# Patient Record
Sex: Female | Born: 1985 | Race: Black or African American | Hispanic: No | Marital: Single | Smoking: Current some day smoker
Health system: Southern US, Community
[De-identification: ages and names within clinical notes are randomized; demographics above are authoritative.]

## PROBLEM LIST (undated history)

## (undated) ENCOUNTER — Emergency Department (HOSPITAL_COMMUNITY): Admission: EM | Payer: Self-pay | Source: Home / Self Care

## (undated) DIAGNOSIS — H3322 Serous retinal detachment, left eye: Secondary | ICD-10-CM

## (undated) DIAGNOSIS — J45909 Unspecified asthma, uncomplicated: Secondary | ICD-10-CM

## (undated) HISTORY — PX: RETINAL DETACHMENT SURGERY: SHX105

---
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
## 2008-09-10 ENCOUNTER — Emergency Department (HOSPITAL_COMMUNITY): Admission: EM | Admit: 2008-09-10 | Discharge: 2008-09-10 | Payer: Self-pay | Admitting: Emergency Medicine

## 2010-07-28 NOTE — Op Note (Signed)
Jennifer Moyer, Jennifer Moyer              ACCOUNT NO.:  [id number]   MEDICAL RECORD NO.:  [id number]          PATIENT TYPE:  AMB   LOCATION:  SDS                          FACILITY:  [facility]   PHYSICIAN:  Alford Highland. Rankin, M.D.   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   PROCEDURES:  1. Scleral buckle using two 40, 70 and 287 silicone explants with      retinal chromopexy left eye.  2. External drainage subretinal fluid left eye.   SURGEON:  Alford Highland. Rankin, M.D.   ANESTHESIA:  General endotracheal anesthesia.   INDICATIONS FOR PROCEDURE:  Patient is a 25 year old woman who has  idiopathic rhegmatogenous detachment left eye superiorly, multiple  retinal breaks with risk of progression subretinal tension.  This is  probably related to old trauma that was largely asymptomatic and has  developed a crescent shape, visual field loss in the left eye  inferiorly.  Patient understands this is an attempt to reattach her  retina to prevent progression and to restore some peripheral visual  function as well.  She understands the risks of anesthesia, including  recurrence, loss of the eye, included but not limited to hemorrhage,  infection, scarring, new post hemorrhage, no change of vision, loss of  vision, and progression of disease despite intervention.  After  appropriate signed consent was obtained, patient was taken to the  operating room.  In the operating room, general endotracheal anesthesia  was instituted without difficulty.  Left periocular region was sterilely  prepped and draped in the usual ophthalmic fashion.  A lid speculum was  applied.  The conjunctival peritomy was then fashioned 360 degrees.  The  rectus muscle was isolated on 2-0 silk  ties.  Indirect ophthalmoscopy  was then performed.  A retinal hole was identified superiorly between  the 11 and 12 o'clock positions, as well as some atrophic areas were  treated with transscleral retinal chromopexy.  No complications  occurred.  The bed of the detachment extended from approximately the 10  o'clock position over superiorly to the 2 o'clock position.  287 explant  was selected with a 240 encircling band that was placed under the rectus  muscles and the buckle itself was from the 9:30 position over to the  2:30 position.  The ends of the band were enjoined in the inferonasal  quadrant with a 70 sleeve.  At this time, the buckle was then  temporarily tied with 5-0 Mersilene mattress sutures.  External drainage  of subretinal fluid was then carried out in the superonasal quadrant  with direct visualization with the head scope.  At this time, all fluid  was removed and this without difficulty.  No complications occurred.  The buckle was then tightened to the appropriate tension and height.  The band was then secured and tightened.  It was necessary to perform  aqueous  paracentesis, which was done.  0.1 mL was removed without  difficulty.  At this time, the band and the buckle were irrigated with  Bugg juice.  The retina was reattached  nicely with 360 degrees.  At this time, the conjunctiva and tenons were  closed in layers with 7-0 Vicryl sutures.  Subconjunctival Decadron and  antibiotic applied.  Sterile patch and Fox shield applied.  Patient  tolerated the procedure without complication.      Alford Highland Rankin, M.D.  Electronically Signed     GAR/MEDQ  D:  11/09/2006  T:  11/10/2006  Job:  [id number]

## 2010-12-25 LAB — CBC
HCT: 37.7
Hemoglobin: 12.8
MCHC: 33.8
RBC: 3.97
RDW: 14

## 2010-12-25 LAB — BASIC METABOLIC PANEL
CO2: 27
Calcium: 9.3
GFR calc Af Amer: >60
Glucose, Bld: 93
Potassium: 3.7
Sodium: 139

## 2011-12-26 ENCOUNTER — Emergency Department (HOSPITAL_COMMUNITY)
Admission: EM | Admit: 2011-12-26 | Discharge: 2011-12-26 | Disposition: A | Discharge status: Home / Self Care | Payer: No Typology Code available for payment source | Attending: Emergency Medicine | Admitting: Emergency Medicine

## 2011-12-26 ENCOUNTER — Emergency Department (HOSPITAL_COMMUNITY): Payer: No Typology Code available for payment source

## 2011-12-26 ENCOUNTER — Encounter (HOSPITAL_COMMUNITY): Payer: Self-pay | Admitting: *Deleted

## 2011-12-26 DIAGNOSIS — M549 Dorsalgia, unspecified: Secondary | ICD-10-CM

## 2011-12-26 DIAGNOSIS — M542 Cervicalgia: Secondary | ICD-10-CM | POA: Insufficient documentation

## 2011-12-26 DIAGNOSIS — M546 Pain in thoracic spine: Secondary | ICD-10-CM | POA: Insufficient documentation

## 2011-12-26 HISTORY — DX: Serous retinal detachment, left eye: H33.22

## 2011-12-26 HISTORY — DX: Unspecified asthma, uncomplicated: J45.909

## 2011-12-26 MED ORDER — HYDROCODONE-ACETAMINOPHEN 5-325 MG PO TABS
1.0000 | ORAL_TABLET | Freq: Once | ORAL | Status: AC
  Administered 2011-12-26: 1 via ORAL
  Filled 2011-12-26: qty 1

## 2011-12-26 MED ORDER — IBUPROFEN 800 MG PO TABS: 800.0000 mg | ORAL_TABLET | Freq: Three times a day (TID) | ORAL | Status: DC | PRN

## 2011-12-26 MED ORDER — CYCLOBENZAPRINE HCL 10 MG PO TABS: 10.0000 mg | ORAL_TABLET | Freq: Three times a day (TID) | ORAL | Status: DC | PRN
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
## 2011-12-26 NOTE — ED Notes (Signed)
Pt discharged home with family. Pt informed to keep ice on her neck and upper back and to take caution with activities on her pain medication. Pt advised to use ice, and come back for complications.

## 2011-12-27 NOTE — ED Provider Notes (Signed)
Medical screening examination/treatment/procedure(s) were performed by non-physician practitioner and as supervising physician I was immediately available for consultation/collaboration.  Doug Sou, MD 12/27/11 [phone]

## 2012-02-01 ENCOUNTER — Emergency Department (HOSPITAL_COMMUNITY)
Admission: EM | Admit: 2012-02-01 | Discharge: 2012-02-01 | Disposition: A | Discharge status: Home / Self Care | Payer: No Typology Code available for payment source | Attending: Emergency Medicine | Admitting: Emergency Medicine

## 2012-02-01 ENCOUNTER — Encounter (HOSPITAL_COMMUNITY): Payer: Self-pay | Admitting: Emergency Medicine

## 2012-02-01 ENCOUNTER — Emergency Department (HOSPITAL_COMMUNITY): Admission: EM | Admit: 2012-02-01 | Discharge: 2012-02-01 | Discharge status: Against Medical Advice | Payer: Self-pay

## 2012-02-01 DIAGNOSIS — Y929 Unspecified place or not applicable: Secondary | ICD-10-CM | POA: Insufficient documentation

## 2012-02-01 DIAGNOSIS — R42 Dizziness and giddiness: Secondary | ICD-10-CM | POA: Insufficient documentation

## 2012-02-01 DIAGNOSIS — R51 Headache: Secondary | ICD-10-CM

## 2012-02-01 DIAGNOSIS — Z8669 Personal history of other diseases of the nervous system and sense organs: Secondary | ICD-10-CM | POA: Insufficient documentation

## 2012-02-01 DIAGNOSIS — Z79899 Other long term (current) drug therapy: Secondary | ICD-10-CM | POA: Insufficient documentation

## 2012-02-01 DIAGNOSIS — S060X9A Concussion with loss of consciousness of unspecified duration, initial encounter: Secondary | ICD-10-CM

## 2012-02-01 DIAGNOSIS — J45909 Unspecified asthma, uncomplicated: Secondary | ICD-10-CM | POA: Insufficient documentation

## 2012-02-01 DIAGNOSIS — S060XAA Concussion with loss of consciousness status unknown, initial encounter: Secondary | ICD-10-CM | POA: Insufficient documentation

## 2012-02-01 DIAGNOSIS — Y939 Activity, unspecified: Secondary | ICD-10-CM | POA: Insufficient documentation

## 2012-02-01 DIAGNOSIS — X58XXXA Exposure to other specified factors, initial encounter: Secondary | ICD-10-CM | POA: Insufficient documentation

## 2012-02-01 MED ORDER — SODIUM CHLORIDE 0.9 % IV BOLUS (SEPSIS)
1000.0000 mL | Freq: Once | INTRAVENOUS | Status: AC
  Administered 2012-02-01: 1000 mL via INTRAVENOUS

## 2012-02-01 MED ORDER — METOCLOPRAMIDE HCL 5 MG/ML IJ SOLN
10.0000 mg | Freq: Once | INTRAMUSCULAR | Status: AC
  Administered 2012-02-01: 10 mg via INTRAVENOUS
  Filled 2012-02-01: qty 2

## 2012-02-01 MED ORDER — KETOROLAC TROMETHAMINE 30 MG/ML IJ SOLN
30.0000 mg | Freq: Once | INTRAMUSCULAR | Status: AC
  Administered 2012-02-01: 30 mg via INTRAVENOUS
  Filled 2012-02-01: qty 1

## 2012-02-01 MED ORDER — CYCLOBENZAPRINE HCL 10 MG PO TABS: 10.0000 mg | ORAL_TABLET | Freq: Two times a day (BID) | ORAL | Status: DC | PRN

## 2012-02-01 MED ORDER — IBUPROFEN 600 MG PO TABS: 600.0000 mg | ORAL_TABLET | Freq: Four times a day (QID) | ORAL | Status: DC | PRN

## 2012-02-01 MED ORDER — HYDROCODONE-ACETAMINOPHEN 5-325 MG PO TABS: 1.0000 | ORAL_TABLET | ORAL | Status: DC | PRN
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
## 2012-02-01 NOTE — ED Notes (Signed)
Pt states that she has had a headache for 2 weeks that started gradually and has gotten worse since then.  States that if she stands up too fast, she gets lightheaded.  Also states that sometimes her middle and ring fingertips on her right hand go numb.  Pain 6/10.  States that she gets nauseous sometimes.  No vomiting or diarrhea.

## 2014-03-21 ENCOUNTER — Encounter: Payer: Self-pay | Admitting: Sports Medicine

## 2014-03-21 ENCOUNTER — Ambulatory Visit (INDEPENDENT_AMBULATORY_CARE_PROVIDER_SITE_OTHER): Payer: 59 | Admitting: Sports Medicine

## 2014-03-21 VITALS — BP 122/60 | HR 58 | Temp 98.4°F | Resp 18

## 2014-03-21 DIAGNOSIS — R300 Dysuria: Secondary | ICD-10-CM

## 2014-03-21 DIAGNOSIS — Z202 Contact with and (suspected) exposure to infections with a predominantly sexual mode of transmission: Secondary | ICD-10-CM

## 2014-03-21 LAB — POCT URINALYSIS DIPSTICK
BILIRUBIN UA: NEGATIVE
GLUCOSE UA: NEGATIVE
KETONES UA: NEGATIVE
Leukocytes, UA: NEGATIVE
Nitrite, UA: NEGATIVE
Protein, UA: NEGATIVE
SPEC GRAV UA: 1.02
UROBILINOGEN UA: 0.2
pH, UA: 7.5

## 2014-03-21 LAB — POCT WET PREP WITH KOH
CLUE CELLS WET PREP PER HPF POC: NEGATIVE
KOH PREP POC: NEGATIVE
RBC WET PREP PER HPF POC: NEGATIVE
Trichomonas, UA: NEGATIVE
Yeast Wet Prep HPF POC: NEGATIVE

## 2014-03-21 LAB — POCT UA - MICROSCOPIC ONLY
CASTS, UR, LPF, POC: NEGATIVE
CRYSTALS, UR, HPF, POC: NEGATIVE
MUCUS UA: NEGATIVE
Yeast, UA: NEGATIVE

## 2014-03-21 LAB — POCT URINE PREGNANCY: Preg Test, Ur: NEGATIVE

## 2014-03-21 MED ORDER — AZITHROMYCIN 500 MG PO TABS: 1000.0000 mg | ORAL_TABLET | Freq: Once | ORAL | Status: DC

## 2014-03-21 MED ORDER — CEFTRIAXONE SODIUM 1 G IJ SOLR
500.0000 mg | Freq: Once | INTRAMUSCULAR | Status: AC
  Administered 2014-03-21: 500 mg via INTRAMUSCULAR
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
## 2014-03-21 NOTE — Patient Instructions (Signed)
We are treating for both Gonorrhea and Chlamydia emperically due to your symptoms.  We will be in touch with the final results of your blood work and the results of the Gonorrhea and Chlamydia tests  Sexually Transmitted Disease A sexually transmitted disease (STD) is a disease or infection that may be passed (transmitted) from person to person, usually during sexual activity. This may happen by way of saliva, semen, blood, vaginal mucus, or urine. Common STDs include:   Gonorrhea.   Chlamydia.   Syphilis.   HIV and AIDS.   Genital herpes.   Hepatitis B and C.   Trichomonas.   Human papillomavirus (HPV).   Pubic lice.   Scabies.  Mites.  Bacterial vaginosis. WHAT ARE CAUSES OF STDs? An STD may be caused by bacteria, a virus, or parasites. STDs are often transmitted during sexual activity if one person is infected. However, they may also be transmitted through nonsexual means. STDs may be transmitted after:   Sexual intercourse with an infected person.   Sharing sex toys with an infected person.   Sharing needles with an infected person or using unclean piercing or tattoo needles.  Having intimate contact with the genitals, mouth, or rectal areas of an infected person.   Exposure to infected fluids during birth. WHAT ARE THE SIGNS AND SYMPTOMS OF STDs? Different STDs have different symptoms. Some people may not have any symptoms. If symptoms are present, they may include:   Painful or bloody urination.   Pain in the pelvis, abdomen, vagina, anus, throat, or eyes.   A skin rash, itching, or irritation.  Growths, ulcerations, blisters, or sores in the genital and anal areas.  Abnormal vaginal discharge with or without bad odor.   Penile discharge in men.   Fever.   Pain or bleeding during sexual intercourse.   Swollen glands in the groin area.   Yellow skin and eyes (jaundice). This is seen with hepatitis.   Swollen  testicles.  Infertility.  Sores and blisters in the mouth. HOW ARE STDs DIAGNOSED? To make a diagnosis, your health care provider may:   Take a medical history.   Perform a physical exam.   Take a sample of any discharge to examine.  Swab the throat, cervix, opening to the penis, rectum, or vagina for testing.  Test a sample of your first morning urine.   Perform blood tests.   Perform a Pap test, if this applies.   Perform a colposcopy.   Perform a laparoscopy.  HOW ARE STDs TREATED? Treatment depends on the STD. Some STDs may be treated but not cured.   Chlamydia, gonorrhea, trichomonas, and syphilis can be cured with antibiotic medicine.   Genital herpes, hepatitis, and HIV can be treated, but not cured, with prescribed medicines. The medicines lessen symptoms.   Genital warts from HPV can be treated with medicine or by freezing, burning (electrocautery), or surgery. Warts may come back.   HPV cannot be cured with medicine or surgery. However, abnormal areas may be removed from the cervix, vagina, or vulva.   If your diagnosis is confirmed, your recent sexual partners need treatment. This is true even if they are symptom-free or have a negative culture or evaluation. They should not have sex until their health care providers say it is okay. HOW CAN I REDUCE MY RISK OF GETTING AN STD? Take these steps to reduce your risk of getting an STD:  Use latex condoms, dental dams, and water-soluble lubricants during sexual activity. Do not use  petroleum jelly or oils.  Avoid having multiple sex partners.  Do not have sex with someone who has other sex partners.  Do not have sex with anyone you do not know or who is at high risk for an STD.  Avoid risky sex practices that can break your skin.  Do not have sex if you have open sores on your mouth or skin.  Avoid drinking too much alcohol or taking illegal drugs. Alcohol and drugs can affect your judgment and put  you in a vulnerable position.  Avoid engaging in oral and anal sex acts.  Get vaccinated for HPV and hepatitis. If you have not received these vaccines in the past, talk to your health care provider about whether one or both might be right for you.   If you are at risk of being infected with HIV, it is recommended that you take a prescription medicine daily to prevent HIV infection. This is called pre-exposure prophylaxis (PrEP). You are considered at risk if:  You are a man who has sex with other men (MSM).  You are a heterosexual man or woman and are sexually active with more than one partner.  You take drugs by injection.  You are sexually active with a partner who has HIV.  Talk with your health care provider about whether you are at high risk of being infected with HIV. If you choose to begin PrEP, you should first be tested for HIV. You should then be tested every 3 months for as long as you are taking PrEP.  WHAT SHOULD I DO IF I THINK I HAVE AN STD?  See your health care provider.   Tell your sexual partner(s). They should be tested and treated for any STDs.  Do not have sex until your health care provider says it is okay. WHEN SHOULD I GET IMMEDIATE MEDICAL CARE? Contact your health care provider right away if:   You have severe abdominal pain.  You are a man and notice swelling or pain in your testicles.  You are a woman and notice swelling or pain in your vagina. Document Released: 05/22/2002 Document Revised: 03/06/2013 Document Reviewed: 09/19/2012 [facility] Patient Information 2015 [address] Addition[employer]Care, [place]. This information is not intended to replace advice given to you by your health care provider. Make sure you discuss any questions you have with your health care provider.

## 2014-03-22 LAB — RPR

## 2014-03-22 LAB — HIV ANTIBODY (ROUTINE TESTING W REFLEX): HIV 1&2 Ab, 4th Generation: NONREACTIVE

## 2014-03-23 NOTE — Progress Notes (Signed)
History and physical examinations reviewed.  Agree with assessment and plan. Danyael Alipio Martin Ziah Leandro, M.D. [facility]  [place] [address] [place], [place]  [address] [phone] phone [phone] fax  

## 2014-03-26 LAB — GC/CHLAMYDIA PROBE AMP
CT PROBE, AMP APTIMA: NEGATIVE
GC Probe RNA: NEGATIVE

## 2016-02-16 ENCOUNTER — Emergency Department (HOSPITAL_COMMUNITY)
Admission: EM | Admit: 2016-02-16 | Discharge: 2016-02-17 | Disposition: A | Discharge status: Home / Self Care | Payer: 59 | Attending: Emergency Medicine | Admitting: Emergency Medicine

## 2016-02-16 ENCOUNTER — Emergency Department (HOSPITAL_COMMUNITY): Payer: 59

## 2016-02-16 ENCOUNTER — Encounter (HOSPITAL_COMMUNITY): Payer: Self-pay | Admitting: Emergency Medicine

## 2016-02-16 DIAGNOSIS — F17200 Nicotine dependence, unspecified, uncomplicated: Secondary | ICD-10-CM | POA: Diagnosis not present

## 2016-02-16 DIAGNOSIS — R05 Cough: Secondary | ICD-10-CM | POA: Diagnosis present

## 2016-02-16 DIAGNOSIS — Z79899 Other long term (current) drug therapy: Secondary | ICD-10-CM | POA: Diagnosis not present

## 2016-02-16 DIAGNOSIS — J45909 Unspecified asthma, uncomplicated: Secondary | ICD-10-CM | POA: Diagnosis not present

## 2016-02-16 DIAGNOSIS — B349 Viral infection, unspecified: Secondary | ICD-10-CM | POA: Insufficient documentation

## 2016-02-16 LAB — COMPREHENSIVE METABOLIC PANEL
ALBUMIN: 4.2 g/dL (ref 3.5–5.0)
ALK PHOS: 45 U/L (ref 38–126)
ALT: 21 U/L (ref 14–54)
ANION GAP: 8 (ref 5–15)
AST: 26 U/L (ref 15–41)
BUN: 12 mg/dL (ref 6–20)
CHLORIDE: 106 mmol/L (ref 101–111)
CO2: 25 mmol/L (ref 22–32)
Calcium: 8.8 mg/dL — ABNORMAL LOW (ref 8.9–10.3)
Creatinine, Ser: 0.61 mg/dL (ref 0.44–1.00)
GFR calc non Af Amer: >60 mL/min (ref >60)
GLUCOSE: 82 mg/dL (ref 65–99)
POTASSIUM: 4 mmol/L (ref 3.5–5.1)
SODIUM: 139 mmol/L (ref 135–145)
Total Bilirubin: 0.9 mg/dL (ref 0.3–1.2)
Total Protein: 7 g/dL (ref 6.5–8.1)

## 2016-02-16 LAB — CBC
HEMATOCRIT: 39.2 % (ref 36.0–46.0)
HEMOGLOBIN: 13.4 g/dL (ref 12.0–15.0)
MCH: 32.5 pg (ref 26.0–34.0)
MCHC: 34.2 g/dL (ref 30.0–36.0)
MCV: 95.1 fL (ref 78.0–100.0)
Platelets: 258 10*3/uL (ref 150–400)
RBC: 4.12 MIL/uL (ref 3.87–5.11)
RDW: 12.9 % (ref 11.5–15.5)
WBC: 3.9 10*3/uL — ABNORMAL LOW (ref 4.0–10.5)

## 2016-02-16 LAB — URINALYSIS, ROUTINE W REFLEX MICROSCOPIC
BILIRUBIN URINE: NEGATIVE
Glucose, UA: NEGATIVE mg/dL
HGB URINE DIPSTICK: NEGATIVE
KETONES UR: NEGATIVE mg/dL
Leukocytes, UA: NEGATIVE
Nitrite: NEGATIVE
Protein, ur: NEGATIVE mg/dL
SPECIFIC GRAVITY, URINE: 1.023 (ref 1.005–1.030)
pH: 7 (ref 5.0–8.0)

## 2016-02-16 LAB — I-STAT BETA HCG BLOOD, ED (MC, WL, AP ONLY)

## 2016-02-16 LAB — LIPASE, BLOOD: LIPASE: 61 U/L — AB (ref 11–51)

## 2016-02-16 MED ORDER — ONDANSETRON HCL 4 MG/2ML IJ SOLN
4.0000 mg | Freq: Once | INTRAMUSCULAR | Status: AC
  Administered 2016-02-16: 4 mg via INTRAVENOUS
  Filled 2016-02-16: qty 2

## 2016-02-16 MED ORDER — ONDANSETRON 4 MG PO TBDP: 4.0000 mg | ORAL_TABLET | Freq: Three times a day (TID) | ORAL | 0 refills | Status: AC | PRN

## 2016-02-16 MED ORDER — KETOROLAC TROMETHAMINE 30 MG/ML IJ SOLN
30.0000 mg | Freq: Once | INTRAMUSCULAR | Status: AC
  Administered 2016-02-16: 30 mg via INTRAVENOUS
  Filled 2016-02-16: qty 1

## 2016-02-16 MED ORDER — SODIUM CHLORIDE 0.9 % IV BOLUS (SEPSIS)
1000.0000 mL | Freq: Once | INTRAVENOUS | Status: AC
  Administered 2016-02-17: 1000 mL via INTRAVENOUS

## 2016-02-16 MED ORDER — SODIUM CHLORIDE 0.9 % IV BOLUS (SEPSIS)
1000.0000 mL | Freq: Once | INTRAVENOUS | Status: AC
  Administered 2016-02-16: 1000 mL via INTRAVENOUS

## 2016-02-16 NOTE — ED Notes (Signed)
Pt is aware urine sample is needed. 

## 2016-02-16 NOTE — ED Notes (Signed)
In xray

## 2016-02-16 NOTE — ED Triage Notes (Signed)
Pt states she has been sick since the day before thanksgiving  Pt states sxs are nausea, vomiting, diarrhea, decreased appetite, body aches, headaches, feeling hot then having the chills, intermittent fevers, productive cough, and shortness of breath

## 2016-02-16 NOTE — ED Provider Notes (Signed)
[facility]-EMERGENCY DEPT Provider Note   CSN: [id number] Arrival date & time: 02/16/16  1941     History   Chief Complaint Chief Complaint  Patient presents with  . flu like symptoms    HPI Jennifer Moyer is a 30 y.o. female.  HPI Patient presents with nausea vomiting diarrhea and myalgias headaches chills cough and shortness of breath. She's had for the last 2 weeks. No one has had sick contacts. No known weight loss. Has had a cough with mild production. Slight sore throat. Has had a throbbing headache. Mild dull abdominal pain.   Past Medical History:  Diagnosis Date  . Asthma   . Detached retina, left     There are no active problems to display for this patient.   Past Surgical History:  Procedure Laterality Date  . RETINAL DETACHMENT SURGERY      OB History    No data available       Home Medications    Prior to Admission medications   Medication Sig Start Date End Date Taking? Authorizing Provider  albuterol (PROVENTIL HFA;VENTOLIN HFA) 108 (90 BASE) MCG/ACT inhaler Inhale 2 puffs into the lungs every 6 (six) hours as needed. For shortness of breath or wheezing   Yes Historical Provider, MD  Ascorbic Acid (VITAMIN C PO) Take 1 tablet by mouth daily.   Yes Historical Provider, MD  Fexofenadine HCl (MUCINEX ALLERGY PO) Take 1 tablet by mouth daily.   Yes Historical Provider, MD  ibuprofen (ADVIL,MOTRIN) 200 MG tablet Take 800 mg by mouth every 6 (six) hours as needed.   Yes Historical Provider, MD  ibuprofen (ADVIL,MOTRIN) 800 MG tablet Take 800 mg by mouth every 8 (eight) hours as needed. For pain   Yes Historical Provider, MD  Multiple Vitamins-Minerals (MULTIVITAMIN GUMMIES ADULT) CHEW Chew 1 tablet by mouth every morning.   Yes Historical Provider, MD  Pseudoeph-Doxylamine-DM-APAP (NYQUIL PO) Take 30 mLs by mouth at bedtime.   Yes Historical Provider, MD  vitamin B-12 (CYANOCOBALAMIN) 1000 MCG tablet Take 1,000 mcg by mouth every morning.   Yes Historical  Provider, MD  ondansetron (ZOFRAN-ODT) 4 MG disintegrating tablet Take 1 tablet (4 mg total) by mouth every 8 (eight) hours as needed for nausea or vomiting. 02/16/16   Benjiman CoreNathan Aniruddh Ciavarella, MD    Family History Family History  Problem Relation Age of Onset  . Diabetes Other   . Hypertension Other     Social History Social History  Substance Use Topics  . Smoking status: Current Some Day Smoker  . Smokeless tobacco: Never Used  . Alcohol use No     Allergies   Almond oil   Review of Systems Review of Systems  Constitutional: Positive for appetite change.  HENT: Negative for congestion.   Respiratory: Positive for cough. Negative for shortness of breath.   Cardiovascular: Negative for chest pain.  Gastrointestinal: Positive for abdominal pain, diarrhea, nausea and vomiting.  Genitourinary: Negative for dysuria.  Musculoskeletal: Negative for back pain.  Skin: Negative for wound.  Neurological: Negative for numbness.  Psychiatric/Behavioral: Negative for confusion.     Physical Exam Updated Vital Signs BP 111/98 (BP Location: Right Arm)   Pulse 64   Temp 97.5 F (36.4 C) (Oral)   Resp 18   Ht 5\' 6"  (1.676 m)   Wt 140 lb (63.5 kg)   LMP 02/08/2016 (Approximate)   SpO2 100%   BMI 22.60 kg/m   Physical Exam  Constitutional: She appears well-developed.  HENT:  Head: Atraumatic.  Eyes: EOM are normal.  Neck: Neck supple.  Cardiovascular: Normal rate.   Pulmonary/Chest: Effort normal.  Mildly harsh breath sounds.  Abdominal: Soft. There is no tenderness.  Musculoskeletal: She exhibits no edema.  Neurological: She is alert.  Skin: Skin is warm. Capillary refill takes less than 2 seconds.  Psychiatric: She has a normal mood and affect.     ED Treatments / Results  Labs (all labs ordered are listed, but only abnormal results are displayed) Labs Reviewed  LIPASE, BLOOD - Abnormal; Notable for the following:       Result Value   Lipase 61 (*)    All other  components within normal limits  COMPREHENSIVE METABOLIC PANEL - Abnormal; Notable for the following:    Calcium 8.8 (*)    All other components within normal limits  CBC - Abnormal; Notable for the following:    WBC 3.9 (*)    All other components within normal limits  URINALYSIS, ROUTINE W REFLEX MICROSCOPIC (NOT AT [facility])  I-STAT BETA HCG BLOOD, ED ([facility], [facility], [facility] ONLY)    EKG  EKG Interpretation None       Radiology Dg Chest 2 View  Result Date: 02/16/2016 CLINICAL DATA:  Cough, body ache and weakness. Intermittent fever x2 weeks. EXAM: CHEST  2 VIEW COMPARISON:  Thoracic spine radiographs from 12/26/2011 FINDINGS: The heart size and mediastinal contours are within normal limits. Both lungs are clear. The visualized skeletal structures are unremarkable. IMPRESSION: No active cardiopulmonary disease. Electronically Signed   By: Tollie Ethavid  Kwon M.D.   On: 02/16/2016 23:10    Procedures Procedures (including critical care time)  Medications Ordered in ED Medications  sodium chloride 0.9 % bolus 1,000 mL (1,000 mLs Intravenous New Bag/Given 02/16/16 2316)  sodium chloride 0.9 % bolus 1,000 mL (not administered)  ondansetron (ZOFRAN) injection 4 mg (4 mg Intravenous Given 02/16/16 2316)  ketorolac (TORADOL) 30 MG/ML injection 30 mg (30 mg Intravenous Given 02/16/16 2317)     Initial Impression / Assessment and Plan / ED Course  I have reviewed the triage vital signs and the nursing notes.  Pertinent labs & imaging results that were available during my care of the patient were reviewed by me and considered in my medical decision making (see chart for details).  Clinical Course     Patient with nausea vomiting diarrhea and other viral symptoms. Headache and cough. Labs reassuring urine pending at this time. Second liter of fluid given. Care turned over to Dr. Daun PeacockPalombo likely discharge home.  Final Clinical Impressions(s) / ED Diagnoses   Final diagnoses:  Viral infection    New  Prescriptions New Prescriptions   ONDANSETRON (ZOFRAN-ODT) 4 MG DISINTEGRATING TABLET    Take 1 tablet (4 mg total) by mouth every 8 (eight) hours as needed for nausea or vomiting.     Benjiman CoreNathan Brenae Lasecki, MD 02/16/16 2351

## 2016-02-17 MED ORDER — ALBUTEROL SULFATE HFA 108 (90 BASE) MCG/ACT IN AERS: 1.0000 | INHALATION_SPRAY | Freq: Four times a day (QID) | RESPIRATORY_TRACT | 0 refills | Status: AC | PRN

## 2016-02-17 NOTE — ED Notes (Signed)
Iv placed 23;15 dec 4

## 2017-07-09 ENCOUNTER — Ambulatory Visit (HOSPITAL_COMMUNITY)
Admission: EM | Admit: 2017-07-09 | Discharge: 2017-07-09 | Disposition: A | Discharge status: Home / Self Care | Payer: 59 | Attending: Family Medicine | Admitting: Family Medicine

## 2017-07-09 ENCOUNTER — Ambulatory Visit (INDEPENDENT_AMBULATORY_CARE_PROVIDER_SITE_OTHER): Payer: 59

## 2017-07-09 ENCOUNTER — Other Ambulatory Visit: Payer: Self-pay

## 2017-07-09 ENCOUNTER — Encounter (HOSPITAL_COMMUNITY): Payer: Self-pay | Admitting: Family Medicine

## 2017-07-09 DIAGNOSIS — R109 Unspecified abdominal pain: Secondary | ICD-10-CM | POA: Diagnosis present

## 2017-07-09 DIAGNOSIS — N390 Urinary tract infection, site not specified: Secondary | ICD-10-CM | POA: Insufficient documentation

## 2017-07-09 DIAGNOSIS — K589 Irritable bowel syndrome without diarrhea: Secondary | ICD-10-CM | POA: Diagnosis not present

## 2017-07-09 DIAGNOSIS — R8281 Pyuria: Secondary | ICD-10-CM

## 2017-07-09 LAB — POCT URINALYSIS DIP (DEVICE)
Bilirubin Urine: NEGATIVE
Glucose, UA: NEGATIVE mg/dL
KETONES UR: NEGATIVE mg/dL
Nitrite: NEGATIVE
PH: 6 (ref 5.0–8.0)
Protein, ur: NEGATIVE mg/dL
SPECIFIC GRAVITY, URINE: 1.015 (ref 1.005–1.030)
Urobilinogen, UA: 0.2 mg/dL (ref 0.0–1.0)

## 2017-07-09 MED ORDER — SULFAMETHOXAZOLE-TRIMETHOPRIM 800-160 MG PO TABS: 1.0000 | ORAL_TABLET | Freq: Two times a day (BID) | ORAL | 0 refills | Status: AC

## 2017-07-09 NOTE — Discharge Instructions (Signed)
Lab results suggest the lower abdominal pain is coming from a urinary tract infection.  We are running a urine culture to confirm this.  If symptoms worsen, you will need to go to the emergency department.  You have a tremendous amount of air in your bowel.  This is undoubtedly coming from your recent dietary changes involving caffeine and bowel cleanout.  You may also have lactose intolerance but it is impossible to say given the recent things you have done.  You probably feel better if she stick to clear liquids tonight and tomorrow morning and let the gas pass.

## 2017-07-09 NOTE — ED Triage Notes (Addendum)
Stomach cramp and this is the 2nd time this month. Per pt it's been difficult to walk, lower abd pain that's closer. Per pt her periods lasted longer then usual and last BM was yesterday.

## 2017-07-09 NOTE — ED Provider Notes (Signed)
[facility]   [id number] 07/09/17 Arrival Time: 1830   SUBJECTIVE:  Jennifer Moyer is a 32 y.o. female who presents to the urgent care with complaint of Stomach cramps and this is the 2nd time this month. Per pt it's been difficult to walk, lower abd pain that's closer. Per pt her periods lasted longer then usual and last BM was yesterday.   Patient states that on Thursday she took a "cleanser" and then also took a sports supplement that had plenty of caffeine.  Patient states that stools were loose after this.  Patient had no vomiting    Past Medical History:  Diagnosis Date  . Asthma   . Detached retina, left    Family History  Problem Relation Age of Onset  . Diabetes Other   . Hypertension Other    Social History   Socioeconomic History  . Marital status: Single    Spouse name: Not on file  . Number of children: Not on file  . Years of education: Not on file  . Highest education level: Not on file  Occupational History  . Not on file  Social Needs  . Financial resource strain: Not on file  . Food insecurity:    Worry: Not on file    Inability: Not on file  . Transportation needs:    Medical: Not on file    Non-medical: Not on file  Tobacco Use  . Smoking status: Current Some Day Smoker  . Smokeless tobacco: Never Used  Substance and Sexual Activity  . Alcohol use: No    Alcohol/week: 0.0 oz  . Drug use: No  . Sexual activity: Not on file  Lifestyle  . Physical activity:    Days per week: Not on file    Minutes per session: Not on file  . Stress: Not on file  Relationships  . Social connections:    Talks on phone: Not on file    Gets together: Not on file    Attends religious service: Not on file    Active member of club or organization: Not on file    Attends meetings of clubs or organizations: Not on file    Relationship status: Not on file  . Intimate partner violence:    Fear of current or ex partner: Not on file    Emotionally  abused: Not on file    Physically abused: Not on file    Forced sexual activity: Not on file  Other Topics Concern  . Not on file  Social History Narrative  . Not on file   No outpatient medications have been marked as taking for the 07/09/17 encounter [facility] Encounter).   Allergies  Allergen Reactions  . Almond Oil Other (See Comments)    Reaction: throat itches , no swelling.      ROS: As per HPI, remainder of ROS negative.   OBJECTIVE:   Vitals:   07/09/17 1853  BP: 113/78  Pulse: 89  Temp: 98.1 F (36.7 C)  TempSrc: Oral  SpO2: 97%     General appearance: alert; no distress Eyes: PERRL; EOMI; conjunctiva normal HENT: normocephalic; atraumatic;oral mucosa normal Neck: supple Lungs: clear to auscultation bilaterally Heart: regular rate and rhythm Abdomen: soft, diffuse tenderness with deep palpation; bowel sounds normal; no masses or organomegaly; no guarding or rebound tenderness Back: no CVA tenderness Extremities: no cyanosis or edema; symmetrical with no gross deformities Skin: warm and dry Neurologic: normal gait; grossly normal Psychological: alert and cooperative; normal mood  and affect      Labs:  Results for orders placed or performed during the hospital encounter of 07/09/17  POCT urinalysis dip (device)  Result Value Ref Range   Glucose, UA NEGATIVE NEGATIVE mg/dL   Bilirubin Urine NEGATIVE NEGATIVE   Ketones, ur NEGATIVE NEGATIVE mg/dL   Specific Gravity, Urine 1.015 1.005 - 1.030   Hgb urine dipstick MODERATE (A) NEGATIVE   pH 6.0 5.0 - 8.0   Protein, ur NEGATIVE NEGATIVE mg/dL   Urobilinogen, UA 0.2 0.0 - 1.0 mg/dL   Nitrite NEGATIVE NEGATIVE   Leukocytes, UA SMALL (A) NEGATIVE    Labs Reviewed  POCT URINALYSIS DIP (DEVICE) - Abnormal; Notable for the following components:      Result Value   Hgb urine dipstick MODERATE (*)    Leukocytes, UA SMALL (*)    All other components within normal limits  URINE CULTURE    No  results found.     ASSESSMENT & PLAN:  1. Pyuria   2. Irritable bowel syndrome, unspecified type    Lab results suggest the lower abdominal pain is coming from a urinary tract infection.  We are running a urine culture to confirm this.  If symptoms worsen, you will need to go to the emergency department. Meds ordered this encounter  Medications  . sulfamethoxazole-trimethoprim (BACTRIM DS,SEPTRA DS) 800-160 MG tablet    Sig: Take 1 tablet by mouth 2 (two) times daily for 7 days.    Dispense:  14 tablet    Refill:  0    Reviewed expectations re: course of current medical issues. Questions answered. Outlined signs and symptoms indicating need for more acute intervention. Patient verbalized understanding. After Visit Summary given.    Procedures:      Elvina Sidle, MD 07/09/17 1947

## 2017-07-10 LAB — URINE CULTURE: Culture: NO GROWTH

## 2017-07-11 NOTE — Progress Notes (Signed)
Urine culture does not clearly demonstrate a UTI.  Attempted to reach patient, need to educate on other possible causes of urinary discomfort include chafing; irritation from hygiene product; other pelvic infection (yeast, bacterial vaginosis) or STD; occasional dietary cause (caffeine); bowel issue (constipation); low estrogen effect; kidney stone passage; or interstitial cystitis.  Recheck or followup with your primary care provider for further evaluation if symptoms are not improving. Pt verbalized understanding.

## 2018-11-12 IMAGING — DX DG ABDOMEN 1V
1 series · 1 of 1 positions shown · non-contrast
Comparison: None.

CLINICAL DATA: Cramping and bloating sensation with sharp pains in
LLQ and RLQ x 2 weeks. Urinary frequency has been diminished. Pt's
BM's have been mainly liquid with some mushy chunks w/partially
digested chunks, pt notes a possible mucous appearance, with less
frequency. No surgeries to area, no known bowel issues. Nondiabetic.

EXAM:
ABDOMEN - 1 VIEW

[abdomen kub]
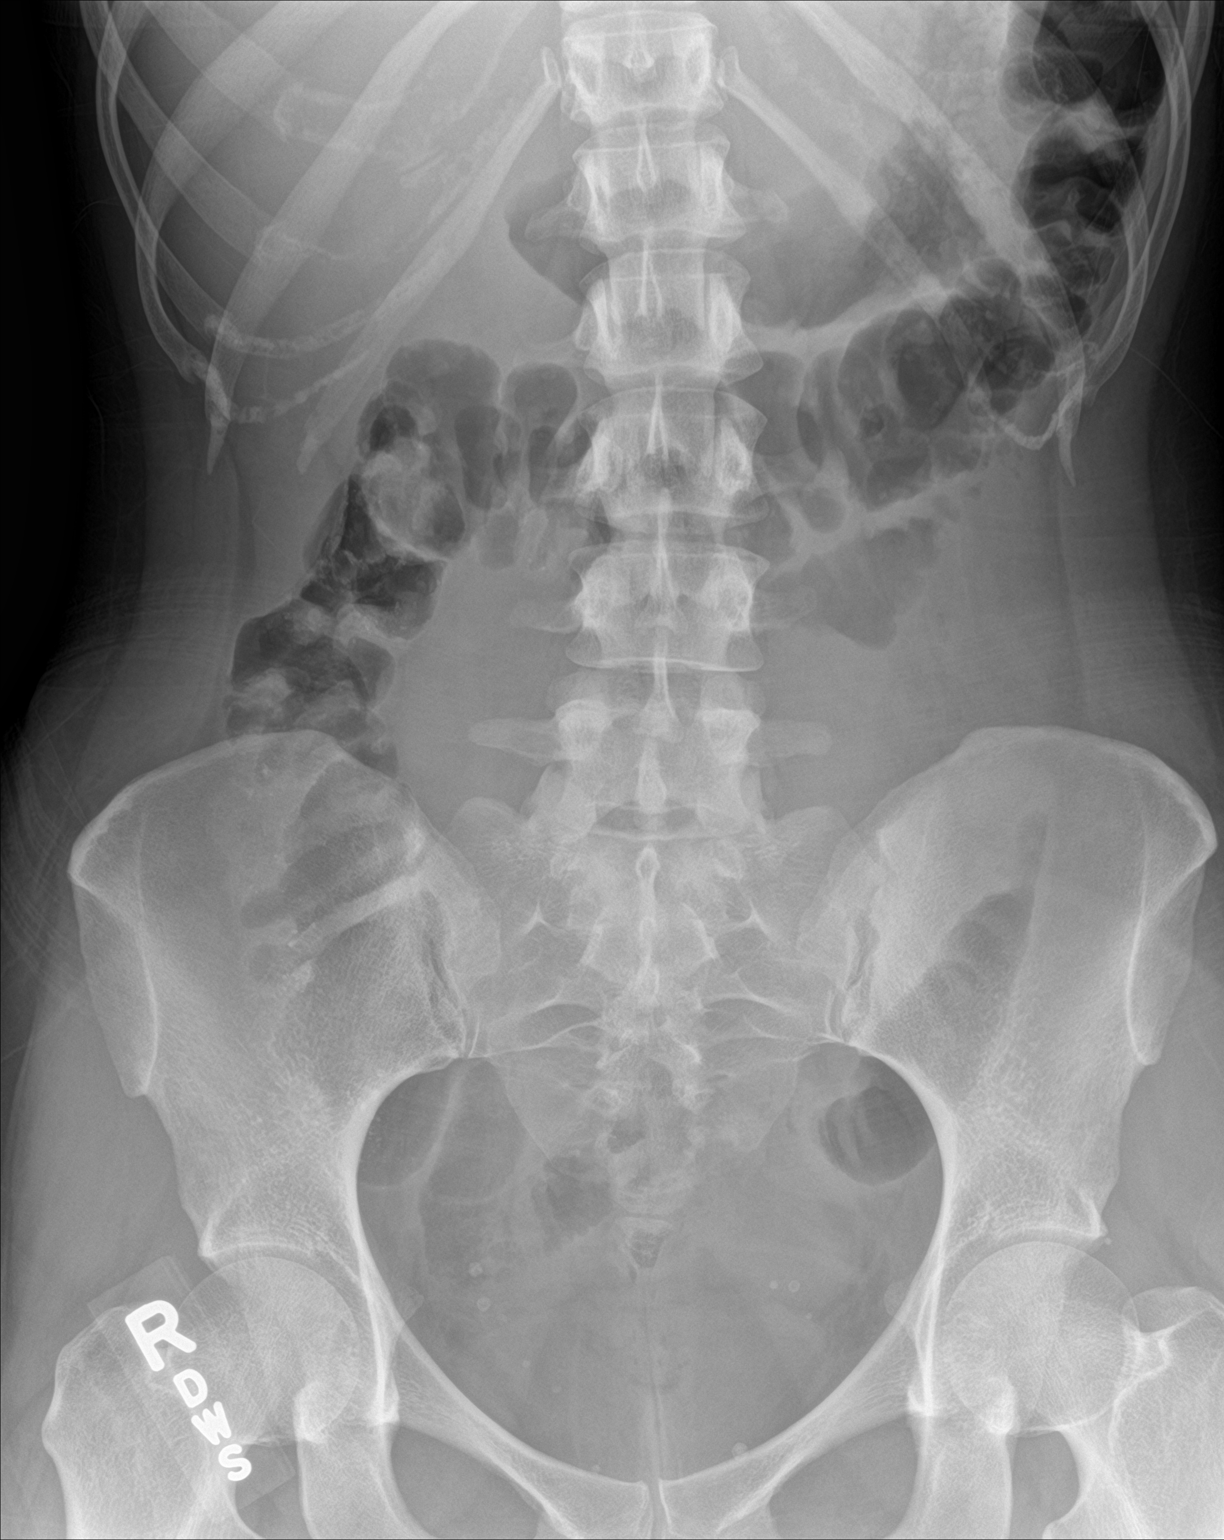

[1 of 1 positions shown; findings below may reference images not displayed]

FINDINGS: The bowel gas pattern is normal. No radio-opaque calculi or other
significant radiographic abnormality are seen.
IMPRESSION: Negative.
